# Patient Record
Sex: Male | Born: 1980 | Race: White | Hispanic: No | Marital: Married | Smoking: Never smoker
Health system: Southern US, Community
[De-identification: ages and names within clinical notes are randomized; demographics above are authoritative.]

## PROBLEM LIST (undated history)

## (undated) DIAGNOSIS — R011 Cardiac murmur, unspecified: Secondary | ICD-10-CM

## (undated) DIAGNOSIS — Z5189 Encounter for other specified aftercare: Secondary | ICD-10-CM

## (undated) DIAGNOSIS — G43909 Migraine, unspecified, not intractable, without status migrainosus: Secondary | ICD-10-CM

## (undated) HISTORY — DX: Encounter for other specified aftercare: Z51.89

## (undated) HISTORY — PX: KNEE SURGERY: SHX244

## (undated) HISTORY — DX: Cardiac murmur, unspecified: R01.1

## (undated) HISTORY — PX: CARDIAC SURGERY: SHX584

---

## 2015-11-19 ENCOUNTER — Encounter (HOSPITAL_BASED_OUTPATIENT_CLINIC_OR_DEPARTMENT_OTHER): Payer: Self-pay | Admitting: *Deleted

## 2015-11-19 DIAGNOSIS — Y9389 Activity, other specified: Secondary | ICD-10-CM | POA: Insufficient documentation

## 2015-11-19 DIAGNOSIS — Y999 Unspecified external cause status: Secondary | ICD-10-CM | POA: Insufficient documentation

## 2015-11-19 DIAGNOSIS — S161XXA Strain of muscle, fascia and tendon at neck level, initial encounter: Secondary | ICD-10-CM | POA: Diagnosis not present

## 2015-11-19 DIAGNOSIS — S46911A Strain of unspecified muscle, fascia and tendon at shoulder and upper arm level, right arm, initial encounter: Secondary | ICD-10-CM | POA: Insufficient documentation

## 2015-11-19 DIAGNOSIS — Y929 Unspecified place or not applicable: Secondary | ICD-10-CM | POA: Diagnosis not present

## 2015-11-19 DIAGNOSIS — S4991XA Unspecified injury of right shoulder and upper arm, initial encounter: Secondary | ICD-10-CM | POA: Diagnosis present
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
## 2015-11-20 ENCOUNTER — Emergency Department (HOSPITAL_BASED_OUTPATIENT_CLINIC_OR_DEPARTMENT_OTHER)
Admission: EM | Admit: 2015-11-20 | Discharge: 2015-11-20 | Disposition: A | Discharge status: Home / Self Care | Payer: Managed Care, Other (non HMO) | Attending: Emergency Medicine | Admitting: Emergency Medicine

## 2015-11-20 DIAGNOSIS — T148XXA Other injury of unspecified body region, initial encounter: Secondary | ICD-10-CM

## 2015-11-20 HISTORY — DX: Migraine, unspecified, not intractable, without status migrainosus: G43.909

## 2015-11-20 MED ORDER — HYDROCODONE-ACETAMINOPHEN 5-325 MG PO TABS: ORAL_TABLET | ORAL | Status: AC

## 2015-11-20 MED ORDER — METHOCARBAMOL 500 MG PO TABS: 500.0000 mg | ORAL_TABLET | Freq: Three times a day (TID) | ORAL | Status: AC

## 2015-11-20 MED ORDER — DICLOFENAC SODIUM 75 MG PO TBEC: 75.0000 mg | DELAYED_RELEASE_TABLET | Freq: Two times a day (BID) | ORAL | Status: AC
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
## 2015-11-26 ENCOUNTER — Telehealth: Payer: Self-pay | Admitting: *Deleted

## 2015-11-26 NOTE — Telephone Encounter (Signed)
Unable to reach patient at time of pre-visit call. Left message for patient to return call when available.  

## 2015-11-27 ENCOUNTER — Ambulatory Visit (HOSPITAL_BASED_OUTPATIENT_CLINIC_OR_DEPARTMENT_OTHER)
Admission: RE | Admit: 2015-11-27 | Discharge: 2015-11-27 | Disposition: A | Discharge status: Home / Self Care | Payer: Managed Care, Other (non HMO) | Source: Ambulatory Visit | Attending: Family Medicine | Admitting: Family Medicine

## 2015-11-27 ENCOUNTER — Encounter: Payer: Self-pay | Admitting: Family Medicine

## 2015-11-27 ENCOUNTER — Ambulatory Visit (INDEPENDENT_AMBULATORY_CARE_PROVIDER_SITE_OTHER): Payer: Managed Care, Other (non HMO) | Admitting: Family Medicine

## 2015-11-27 DIAGNOSIS — Z131 Encounter for screening for diabetes mellitus: Secondary | ICD-10-CM

## 2015-11-27 DIAGNOSIS — R7401 Elevation of levels of liver transaminase levels: Secondary | ICD-10-CM

## 2015-11-27 DIAGNOSIS — S46911A Strain of unspecified muscle, fascia and tendon at shoulder and upper arm level, right arm, initial encounter: Secondary | ICD-10-CM | POA: Diagnosis not present

## 2015-11-27 DIAGNOSIS — H5702 Anisocoria: Secondary | ICD-10-CM | POA: Insufficient documentation

## 2015-11-27 DIAGNOSIS — Z13220 Encounter for screening for lipoid disorders: Secondary | ICD-10-CM

## 2015-11-27 DIAGNOSIS — R52 Pain, unspecified: Secondary | ICD-10-CM | POA: Diagnosis present

## 2015-11-27 DIAGNOSIS — Z23 Encounter for immunization: Secondary | ICD-10-CM | POA: Diagnosis not present

## 2015-11-27 DIAGNOSIS — S161XXA Strain of muscle, fascia and tendon at neck level, initial encounter: Secondary | ICD-10-CM | POA: Diagnosis not present

## 2015-11-27 DIAGNOSIS — Z130 Encounter for screening for diseases of the blood and blood-forming organs and certain disorders involving the immune mechanism: Secondary | ICD-10-CM | POA: Diagnosis not present

## 2015-11-27 DIAGNOSIS — R740 Nonspecific elevation of levels of transaminase and lactic acid dehydrogenase [LDH]: Secondary | ICD-10-CM

## 2015-11-27 DIAGNOSIS — G43009 Migraine without aura, not intractable, without status migrainosus: Secondary | ICD-10-CM

## 2015-11-27 LAB — COMPREHENSIVE METABOLIC PANEL
ALK PHOS: 47 U/L (ref 39–117)
ALT: 72 U/L — ABNORMAL HIGH (ref 0–53)
AST: 40 U/L — AB (ref 0–37)
Albumin: 4.4 g/dL (ref 3.5–5.2)
BUN: 15 mg/dL (ref 6–23)
CO2: 29 mEq/L (ref 19–32)
Calcium: 9.5 mg/dL (ref 8.4–10.5)
Chloride: 105 mEq/L (ref 96–112)
Creatinine, Ser: 0.97 mg/dL (ref 0.40–1.50)
GFR: 93.66 mL/min (ref >60.00)
GLUCOSE: 87 mg/dL (ref 70–99)
POTASSIUM: 3.8 meq/L (ref 3.5–5.1)
SODIUM: 139 meq/L (ref 135–145)
Total Bilirubin: 0.4 mg/dL (ref 0.2–1.2)
Total Protein: 7.3 g/dL (ref 6.0–8.3)

## 2015-11-27 LAB — LIPID PANEL
CHOLESTEROL: 146 mg/dL (ref 0–200)
HDL: 29.2 mg/dL — AB (ref >39.00)
LDL Cholesterol: 98 mg/dL (ref 0–99)
NONHDL: 116.58
Total CHOL/HDL Ratio: 5
Triglycerides: 95 mg/dL (ref 0.0–149.0)
VLDL: 19 mg/dL (ref 0.0–40.0)

## 2015-11-27 LAB — CBC
HCT: 46 % (ref 39.0–52.0)
Hemoglobin: 15.6 g/dL (ref 13.0–17.0)
MCHC: 33.9 g/dL (ref 30.0–36.0)
MCV: 87.6 fl (ref 78.0–100.0)
Platelets: 249 10*3/uL (ref 150.0–400.0)
RBC: 5.25 Mil/uL (ref 4.22–5.81)
RDW: 13.7 % (ref 11.5–15.5)
WBC: 5.1 10*3/uL (ref 4.0–10.5)

## 2015-11-27 LAB — HEMOGLOBIN A1C: Hgb A1c MFr Bld: 5.1 % (ref 4.6–6.5)

## 2015-11-27 MED ORDER — ELETRIPTAN HYDROBROMIDE 20 MG PO TABS: 20.0000 mg | ORAL_TABLET | ORAL | Status: DC | PRN

## 2015-11-27 MED ORDER — ELETRIPTAN HYDROBROMIDE 40 MG PO TABS: 40.0000 mg | ORAL_TABLET | ORAL | Status: AC | PRN
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
## 2015-11-27 NOTE — Progress Notes (Signed)
Pre visit review using our clinic tool,if applicable. No additional management support is needed unless otherwise documented below in the visit note.  

## 2015-11-27 NOTE — Patient Instructions (Signed)
Please go to the lab, and then downstairs to imaging to have your x-rays. You may go home after your pictures and I will call you I will be in touch with your labs asap

## 2015-11-30 ENCOUNTER — Encounter: Payer: Self-pay | Admitting: Family Medicine

## 2015-11-30 NOTE — Addendum Note (Signed)
Addended by: Abbe AmsterdamOPLAND, Satina Jerrell C on: 11/30/2015 01:50 PM   Modules accepted: Orders
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
## 2018-10-30 ENCOUNTER — Telehealth: Payer: Self-pay | Admitting: Family Medicine

## 2018-10-30 NOTE — Telephone Encounter (Signed)
LVM for pt to schedule an appt, pt has not been seen since 2017.
